# Patient Record
Sex: Female | Born: 1965 | Race: Black or African American | Hispanic: No | Marital: Married | Smoking: Never smoker
Health system: Southern US, Community
[De-identification: ages and names within clinical notes are randomized; demographics above are authoritative.]

## PROBLEM LIST (undated history)

## (undated) HISTORY — PX: ABDOMINAL HYSTERECTOMY: SHX81

## (undated) HISTORY — PX: KNEE ARTHROSCOPY: SHX127

---

## 2000-08-23 ENCOUNTER — Emergency Department (HOSPITAL_COMMUNITY): Admission: EM | Admit: 2000-08-23 | Discharge: 2000-08-24 | Payer: Self-pay | Admitting: Emergency Medicine

## 2001-04-20 ENCOUNTER — Other Ambulatory Visit: Admission: RE | Admit: 2001-04-20 | Discharge: 2001-04-20 | Payer: Self-pay | Admitting: Family Medicine

## 2001-04-24 ENCOUNTER — Encounter: Payer: Self-pay | Admitting: Gastroenterology

## 2001-04-24 ENCOUNTER — Encounter: Admission: RE | Admit: 2001-04-24 | Discharge: 2001-04-24 | Payer: Self-pay | Admitting: Gastroenterology

## 2008-09-08 ENCOUNTER — Emergency Department (HOSPITAL_COMMUNITY): Admission: EM | Admit: 2008-09-08 | Discharge: 2008-09-08 | Payer: Self-pay | Admitting: Family Medicine

## 2009-02-22 ENCOUNTER — Emergency Department (HOSPITAL_COMMUNITY): Admission: EM | Admit: 2009-02-22 | Discharge: 2009-02-22 | Payer: Self-pay | Admitting: Family Medicine

## 2009-06-23 ENCOUNTER — Emergency Department (HOSPITAL_COMMUNITY): Admission: EM | Admit: 2009-06-23 | Discharge: 2009-06-23 | Payer: Self-pay | Admitting: Emergency Medicine

## 2010-05-05 LAB — POCT URINALYSIS DIP (DEVICE)
Bilirubin Urine: NEGATIVE
Glucose, UA: NEGATIVE mg/dL
Ketones, ur: NEGATIVE mg/dL
Nitrite: POSITIVE — AB
Protein, ur: 100 mg/dL — AB
Specific Gravity, Urine: >=1.03 (ref 1.005–1.030)
Urobilinogen, UA: 0.2 mg/dL (ref 0.0–1.0)
pH: 5.5 (ref 5.0–8.0)

## 2010-05-05 LAB — URINE CULTURE: Colony Count: >=100000

## 2010-05-24 LAB — POCT URINALYSIS DIP (DEVICE)
Bilirubin Urine: NEGATIVE
Glucose, UA: NEGATIVE mg/dL
Nitrite: POSITIVE — AB
Protein, ur: 100 mg/dL — AB
Specific Gravity, Urine: >=1.03 (ref 1.005–1.030)
Urobilinogen, UA: 1 mg/dL (ref 0.0–1.0)
pH: 5.5 (ref 5.0–8.0)

## 2014-09-24 ENCOUNTER — Other Ambulatory Visit: Payer: Self-pay | Admitting: Sports Medicine

## 2014-09-24 DIAGNOSIS — M545 Low back pain: Secondary | ICD-10-CM

## 2014-10-03 ENCOUNTER — Ambulatory Visit
Admission: RE | Admit: 2014-10-03 | Discharge: 2014-10-03 | Disposition: A | Discharge status: Home / Self Care | Payer: Self-pay | Source: Ambulatory Visit | Attending: Sports Medicine | Admitting: Sports Medicine

## 2014-10-03 DIAGNOSIS — M545 Low back pain: Secondary | ICD-10-CM

## 2015-05-10 ENCOUNTER — Emergency Department (HOSPITAL_COMMUNITY)
Admission: EM | Admit: 2015-05-10 | Discharge: 2015-05-10 | Disposition: A | Discharge status: Home / Self Care | Payer: BLUE CROSS/BLUE SHIELD | Attending: Emergency Medicine | Admitting: Emergency Medicine

## 2015-05-10 ENCOUNTER — Encounter (HOSPITAL_COMMUNITY): Payer: Self-pay

## 2015-05-10 DIAGNOSIS — R42 Dizziness and giddiness: Secondary | ICD-10-CM | POA: Diagnosis not present

## 2015-05-10 DIAGNOSIS — Z79899 Other long term (current) drug therapy: Secondary | ICD-10-CM | POA: Diagnosis not present

## 2015-05-10 MED ORDER — MECLIZINE HCL 25 MG PO TABS
12.5000 mg | ORAL_TABLET | Freq: Once | ORAL | Status: AC
  Administered 2015-05-10: 12.5 mg via ORAL
  Filled 2015-05-10: qty 1

## 2015-05-10 MED ORDER — MECLIZINE HCL 25 MG PO TABS: 25.0000 mg | ORAL_TABLET | Freq: Three times a day (TID) | ORAL | Status: DC | PRN

## 2015-05-10 MED ORDER — ONDANSETRON 4 MG PO TBDP
4.0000 mg | ORAL_TABLET | Freq: Once | ORAL | Status: AC
  Administered 2015-05-10: 4 mg via ORAL
  Filled 2015-05-10: qty 1

## 2015-05-10 MED ORDER — ONDANSETRON 4 MG PO TBDP: 4.0000 mg | ORAL_TABLET | Freq: Three times a day (TID) | ORAL | Status: DC | PRN

## 2015-05-10 NOTE — ED Provider Notes (Signed)
CSN: [id number]     Arrival date & time 05/10/15  0904 History   First MD Initiated Contact with Patient 05/10/15 0940     Chief Complaint  Patient presents with  . Dizziness     (Consider location/radiation/quality/duration/timing/severity/associated sxs/prior Treatment) The history is provided by the patient.     Jodi Holloway is a 50 year old female with history of asthma, seasonal allergies and GERD, she presents to the emergency room with complaints of dizziness that she first experienced a year ago, which has recently worsened over the past week.  Patient states that when she is laying on her side in bed and she rolls over it will trigger a spinning of the room that resolved when she sits upright. She states that it only lasts for a few minutes at a time. This morning it occurred and she had associated nausea and felt like she was going to have a bowel movement. Patient describes the dizziness as the room spinning.  She denies associated vomiting, diaphoresis, palpitations, headache, visual disturbances, tinnitus, decreased hearing. The episode this morning began at approximately 8:15 when she got out of bed.  Although the acute dizziness improved, she tried to shower and "felt weird," "didn't feel right."  When she is bending over to wash her leg in the shower she felt like she might lose her balance.   This concerned her and she presented to the emergency room.  She currently has persistent nausea and mild dizziness.  She also states when she massages the back right side of her head and neck she can reproduce his symptoms. She has not seen anyone for this issue before.   She denies recent illness. She denies diplopia, dysphasia, weakness or numbness.  She denies chest pain, shortness of breath, palpitations, diaphoresis.  She denies a history of headaches.     History reviewed. No pertinent past medical history. Past Surgical History  Procedure Laterality Date  . Knee arthroscopy    .  Abdominal hysterectomy     Family History  Problem Relation Age of Onset  . Cancer Mother   . Hyperlipidemia Mother   . Diabetes Father    Social History  Substance Use Topics  . Smoking status: Never Smoker   . Smokeless tobacco: None  . Alcohol Use: Yes     Comment: occasionally   OB History    Gravida Para Term Preterm AB TAB SAB Ectopic Multiple Living   1 1 1       1      Review of Systems  Constitutional: Negative.  Negative for fever, diaphoresis, activity change and appetite change.  HENT: Negative.  Negative for tinnitus.   Eyes: Negative for photophobia, pain, redness and visual disturbance.  Respiratory: Negative.   Cardiovascular: Negative.  Negative for chest pain, palpitations and leg swelling.  Gastrointestinal: Positive for nausea. Negative for vomiting, abdominal pain, diarrhea, constipation and abdominal distention.  Genitourinary: Negative.   Musculoskeletal: Negative.   Skin: Negative.  Negative for color change, pallor and rash.  Neurological: Negative.   Psychiatric/Behavioral: Negative.   All other systems reviewed and are negative.     Allergies  Review of patient's allergies indicates no known allergies.  Home Medications   Prior to Admission medications   Medication Sig Start Date End Date Taking? Authorizing Provider  fexofenadine (ALLEGRA) 180 MG tablet Take 180 mg by mouth daily.   Yes Historical Provider, MD  Fluticasone-Salmeterol (ADVAIR) 100-50 MCG/DOSE AEPB Inhale 1 puff into the lungs 2 (two) times  daily.   Yes Historical Provider, MD  naproxen sodium (ANAPROX) 220 MG tablet Take 440 mg by mouth 2 (two) times daily as needed. For pain   Yes Historical Provider, MD  omeprazole (PRILOSEC) 20 MG capsule Take 20 mg by mouth daily.   Yes Historical Provider, MD  meclizine (ANTIVERT) 25 MG tablet Take 1 tablet (25 mg total) by mouth 3 (three) times daily as needed for dizziness. 05/10/15   Danelle Berry, PA-C  ondansetron (ZOFRAN ODT) 4 MG  disintegrating tablet Take 1 tablet (4 mg total) by mouth every 8 (eight) hours as needed for nausea or vomiting. 05/10/15   Danelle Berry, PA-C   BP 119/79 mmHg  Pulse 66  Temp(Src) 98.8 F (37.1 C) (Oral)  Resp 16  Ht 6' (1.829 m)  Wt 90.719 kg  BMI 27.12 kg/m2  SpO2 100% Physical Exam  Constitutional: She is oriented to person, place, and time. She appears well-developed and well-nourished. No distress.  HENT:  Head: Normocephalic and atraumatic.  Nose: Nose normal.  Mouth/Throat: Oropharynx is clear and moist. No oropharyngeal exudate.  Eyes: Conjunctivae, EOM and lids are normal. Pupils are equal, round, and reactive to light. Right eye exhibits no discharge. Left eye exhibits no discharge. No scleral icterus. Right eye exhibits normal extraocular motion and no nystagmus. Left eye exhibits normal extraocular motion and no nystagmus.  Neck: Normal range of motion. No JVD present. No tracheal deviation present. No thyromegaly present.  Cardiovascular: Normal rate, regular rhythm, normal heart sounds and intact distal pulses.  Exam reveals no gallop and no friction rub.   No murmur heard. Pulmonary/Chest: Effort normal and breath sounds normal. No respiratory distress. She has no wheezes. She has no rales. She exhibits no tenderness.  Abdominal: Soft. Bowel sounds are normal. She exhibits no distension and no mass. There is no tenderness. There is no rebound and no guarding.  Musculoskeletal: Normal range of motion. She exhibits no edema or tenderness.  Lymphadenopathy:    She has no cervical adenopathy.  Neurological: She is alert and oriented to person, place, and time. She has normal reflexes. No cranial nerve deficit. She exhibits normal muscle tone. Coordination normal.  Speech is clear and goal oriented, follows 2 step commands CN 2-12 grossly intact Normal strength in upper and lower extremities bilaterally including dorsiflexion and plantar flexion, strong and equal grip  strength Sensation normal to light and sharp touch Moves extremities without ataxia, coordination intact Normal finger to nose and rapid alternating movements Neg romberg, no pronator drift Normal gait and balance   Skin: Skin is warm and dry. No rash noted. She is not diaphoretic. No erythema. No pallor.  Psychiatric: She has a normal mood and affect. Her behavior is normal. Judgment and thought content normal.  Nursing note and vitals reviewed.   ED Course  Procedures (including critical care time) Labs Review Labs Reviewed - No data to display  Imaging Review No results found. I have personally reviewed and evaluated these images and lab results as part of my medical decision-making.   EKG Interpretation   Date/Time:  Saturday May 10 2015 10:02:14 EDT Ventricular Rate:  83 PR Interval:  188 QRS Duration: 80 QT Interval:  357 QTC Calculation: 419 R Axis:   42 Text Interpretation:  Sinus rhythm Low voltage, precordial leads since  last tracing no significant change Confirmed by BELFI  MD, MELANIE ([id number])  on 05/10/2015 3:36:39 PM      MDM   Pt with dizziness with changes in  position of head, specifically with extension, and when rolling over in bed.  Episodes are reproducible and last <29min.   Patient is afebrile, vital signs stable, nontoxic in appearance.  History of dizziness is most consistent with BPPV, and non-concerning for central vertigo, no red flags. Normal neurological exam, no nuchal rigidity, no fever, no visual disturbances.  She is able ambulate without difficulty, negative Romberg.  Patient has not had any recent illness, do not suspect labyrinthitis. She doesn't have any hearing change or tinnitus, vertigo episodes are brief, do not suspect Mnire's.  Vertigo will be treated with meclizine and Zofran as needed for nausea. She will follow-up with her own ENT, although the on-call ENT was provided for her. She is traveling out of town tonight and wanted to  be checked out prior to leaving.    The patient is safe to discharge home. Return precautions reviewed with patient, who verbalizes understanding. She is discharged in good condition. Vital signs stable.  Filed Vitals:   05/10/15 1115 05/10/15 1130 05/10/15 1140 05/10/15 1230  BP: 119/80 121/80 122/79 119/79  Pulse: 80 75  66  Temp:      TempSrc:      Resp: Height:      Weight:      SpO2: 100% 100%  100%      Final diagnoses:  Vertigo        Danelle Berry, PA-C 05/12/15 1610  Rolan Bucco, MD 05/17/15 [phone]

## 2015-05-10 NOTE — ED Notes (Signed)
Pt states she has been having off and on dizziness for a year that gets worse while laying down and is better if she sits or stands up, patient is alert and oriented during triage and denies any pain

## 2015-05-10 NOTE — Discharge Instructions (Signed)
Dizziness °Dizziness is a common problem. It is a feeling of unsteadiness or light-headedness. You may feel like you are about to faint. Dizziness can lead to injury if you stumble or fall. Anyone can become dizzy, but dizziness is more common in older adults. This condition can be caused by a number of things, including medicines, dehydration, or illness. °HOME CARE INSTRUCTIONS °Taking these steps may help with your condition: °Eating and Drinking °· Drink enough fluid to keep your urine clear or pale yellow. This helps to keep you from becoming dehydrated. Try to drink more clear fluids, such as water. °· Do not drink alcohol. °· Limit your caffeine intake if directed by your health care provider. °· Limit your salt intake if directed by your health care provider. °Activity °· Avoid making quick movements. °· Rise slowly from chairs and steady yourself until you feel okay. °· In the morning, first sit up on the side of the bed. When you feel okay, stand slowly while you hold onto something until you know that your balance is fine. °· Move your legs often if you need to stand in one place for a long time. Tighten and relax your muscles in your legs while you are standing. °· Do not drive or operate heavy machinery if you feel dizzy. °· Avoid bending down if you feel dizzy. Place items in your home so that they are easy for you to reach without leaning over. °Lifestyle °· Do not use any tobacco products, including cigarettes, chewing tobacco, or electronic cigarettes. If you need help quitting, ask your health care provider. °· Try to reduce your stress level, such as with yoga or meditation. Talk with your health care provider if you need help. °General Instructions °· Watch your dizziness for any changes. °· Take medicines only as directed by your health care provider. Talk with your health care provider if you think that your dizziness is caused by a medicine that you are taking. °· Tell a friend or a family  member that you are feeling dizzy. If he or she notices any changes in your behavior, have this person call your health care provider. °· Keep all follow-up visits as directed by your health care provider. This is important. °SEEK MEDICAL CARE IF: °· Your dizziness does not go away. °· Your dizziness or light-headedness gets worse. °· You feel nauseous. °· You have reduced hearing. °· You have new symptoms. °· You are unsteady on your feet or you feel like the room is spinning. °SEEK IMMEDIATE MEDICAL CARE IF: °· You vomit or have diarrhea and are unable to eat or drink anything. °· You have problems talking, walking, swallowing, or using your arms, hands, or legs. °· You feel generally weak. °· You are not thinking clearly or you have trouble forming sentences. It may take a friend or family member to notice this. °· You have chest pain, abdominal pain, shortness of breath, or sweating. °· Your vision changes. °· You notice any bleeding. °· You have a headache. °· You have neck pain or a stiff neck. °· You have a fever. °  °This information is not intended to replace advice given to you by your health care provider. Make sure you discuss any questions you have with your health care provider. °  °Document Released: 07/28/2000 Document Revised: 06/18/2014 Document Reviewed: 01/28/2014 °Elsevier Interactive Patient Education ©2016 [employer] ° °Benign Positional Vertigo °Vertigo is the feeling that you or your surroundings are moving when they are not.   Benign positional vertigo is the most common form of vertigo. The cause of this condition is not serious (is benign). This condition is triggered by certain movements and positions (is positional). This condition can be dangerous if it occurs while you are doing something that could endanger you or others, such as driving.  °CAUSES °In many cases, the cause of this condition is not known. It may be caused by a disturbance in an area of the inner ear that helps your  brain to sense movement and balance. This disturbance can be caused by a viral infection (labyrinthitis), head injury, or repetitive motion. °RISK FACTORS °This condition is more likely to develop in: °· Women. °· People who are 50 years of age or older. °SYMPTOMS °Symptoms of this condition usually happen when you move your head or your eyes in different directions. Symptoms may start suddenly, and they usually last for less than a minute. Symptoms may include: °· Loss of balance and falling. °· Feeling like you are spinning or moving. °· Feeling like your surroundings are spinning or moving. °· Nausea and vomiting. °· Blurred vision. °· Dizziness. °· Involuntary eye movement (nystagmus). °Symptoms can be mild and cause only slight annoyance, or they can be severe and interfere with daily life. Episodes of benign positional vertigo may return (recur) over time, and they may be triggered by certain movements. Symptoms may improve over time. °DIAGNOSIS °This condition is usually diagnosed by medical history and a physical exam of the head, neck, and ears. You may be referred to a health care provider who specializes in ear, nose, and throat (ENT) problems (otolaryngologist) or a provider who specializes in disorders of the nervous system (neurologist). You may have additional testing, including: °· MRI. °· A CT scan. °· Eye movement tests. Your health care provider may ask you to change positions quickly while he or she watches you for symptoms of benign positional vertigo, such as nystagmus. Eye movement may be tested with an electronystagmogram (ENG), caloric stimulation, the Dix-Hallpike test, or the roll test. °· An electroencephalogram (EEG). This records electrical activity in your brain. °· Hearing tests. °TREATMENT °Usually, your health care provider will treat this by moving your head in specific positions to adjust your inner ear back to normal. Surgery may be needed in severe cases, but this is rare. In  some cases, benign positional vertigo may resolve on its own in 2-4 weeks. °HOME CARE INSTRUCTIONS °Safety °· Move slowly. Avoid sudden body or head movements. °· Avoid driving. °· Avoid operating heavy machinery. °· Avoid doing any tasks that would be dangerous to you or others if a vertigo episode would occur. °· If you have trouble walking or keeping your balance, try using a cane for stability. If you feel dizzy or unstable, sit down right away. °· Return to your normal activities as told by your health care provider. Ask your health care provider what activities are safe for you. °General Instructions °· Take over-the-counter and prescription medicines only as told by your health care provider. °· Avoid certain positions or movements as told by your health care provider. °· Drink enough fluid to keep your urine clear or pale yellow. °· Keep all follow-up visits as told by your health care provider. This is important. °SEEK MEDICAL CARE IF: °· You have a fever. °· Your condition gets worse or you develop new symptoms. °· Your family or friends notice any behavioral changes. °· Your nausea or vomiting gets worse. °· You have numbness or a "  pins and needles" sensation. °SEEK IMMEDIATE MEDICAL CARE IF: °· You have difficulty speaking or moving. °· You are always dizzy. °· You faint. °· You develop severe headaches. °· You have weakness in your legs or arms. °· You have changes in your hearing or vision. °· You develop a stiff neck. °· You develop sensitivity to light. °  °This information is not intended to replace advice given to you by your health care provider. Make sure you discuss any questions you have with your health care provider. °  °Document Released: 11/09/2005 Document Revised: 10/23/2014 Document Reviewed: 05/27/2014 °Elsevier Interactive Patient Education ©2016 [employer] ° °

## 2015-05-10 NOTE — ED Notes (Signed)
Pt verbalized understanding of d/c instructions, prescriptions, and follow-up care. No further questions/concerns, VSS, ambulatory w/ steady gait (refused wheelchair) 

## 2017-01-09 IMAGING — MR MR LUMBAR SPINE W/O CM
4 of 5 series · 22 of 48 positions shown · non-contrast
Comparison: Lumbar spine radiographs 09/17/2014

CLINICAL DATA: Low back pain with hip pain for 3-4 months.

EXAM:
MRI LUMBAR SPINE WITHOUT CONTRAST
TECHNIQUE: Multiplanar, multisequence MR imaging of the lumbar spine was
performed. No intravenous contrast was administered.

[Series 4: T2 · sagittal · 4.0mm · 0.44mm/px · 6 of 12 slices shown (1 of 2)]
[im 1/12]
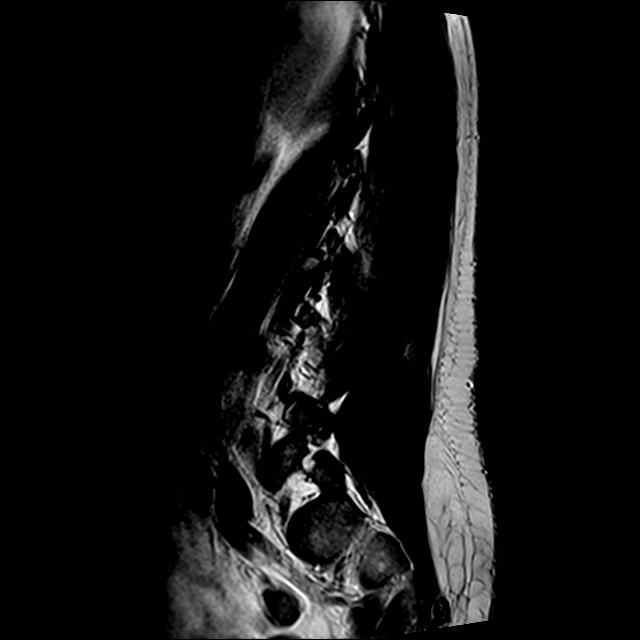
[im 3/12]
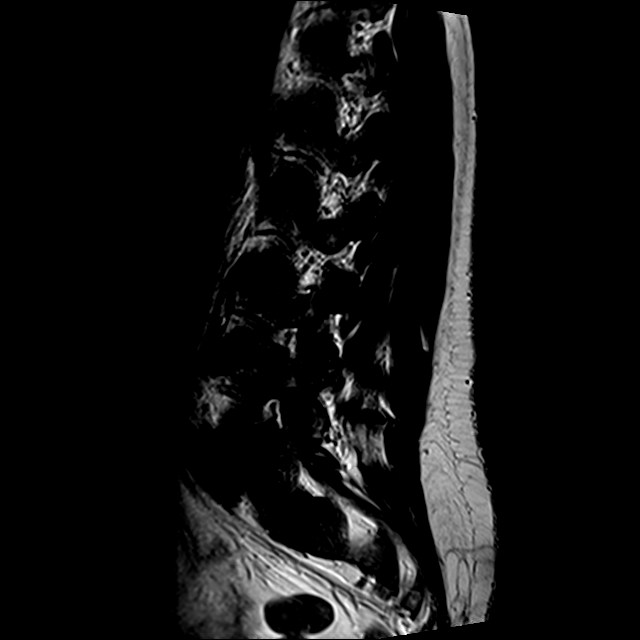
[im 5/12]
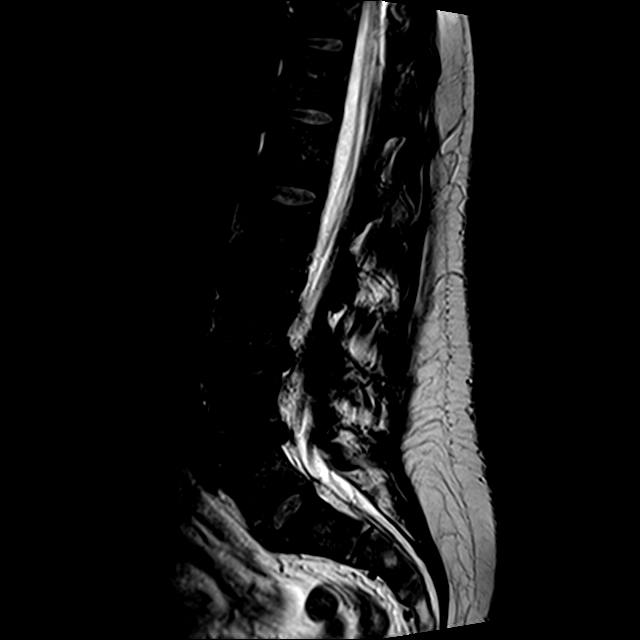
[im 7/12]
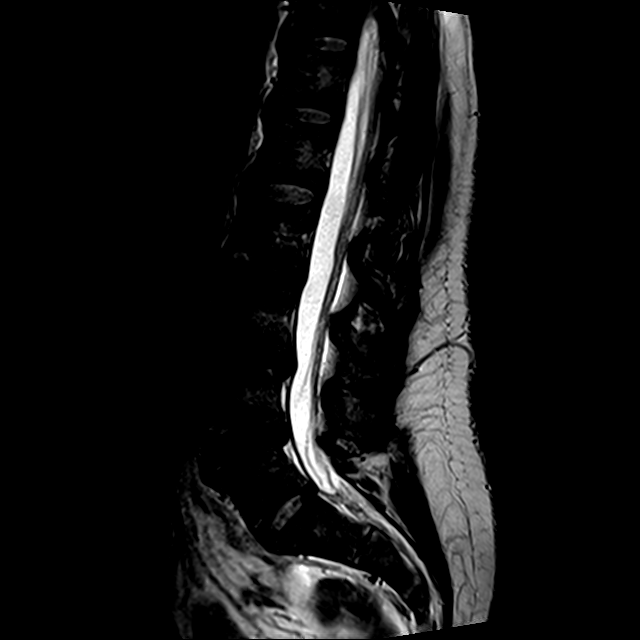
[im 9/12]
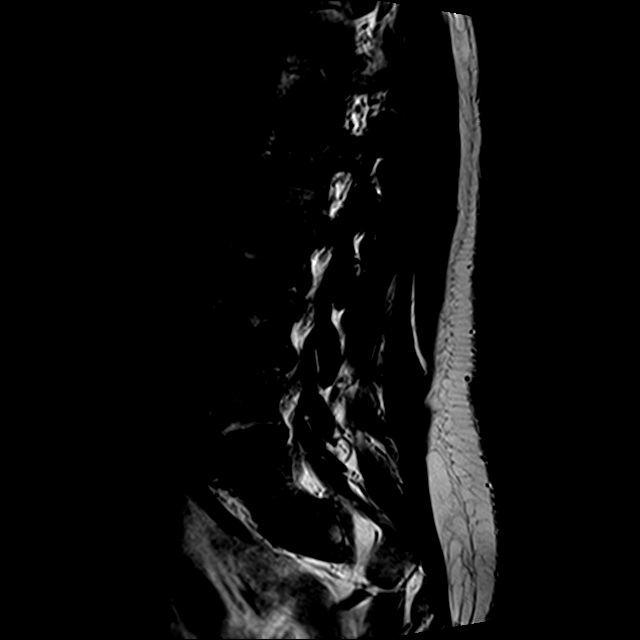
[im 12/12]
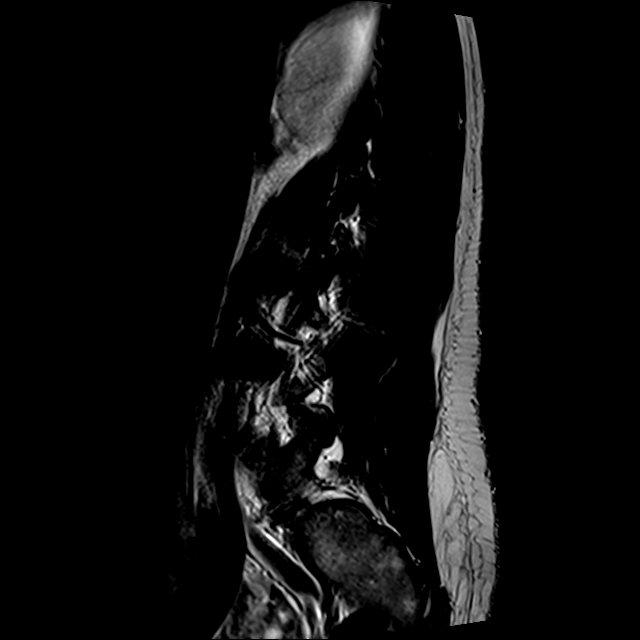

[Series 5: T1 · sagittal · 4.0mm · 0.55mm/px · 4 of 12 slices shown (1 of 2)]
[im 1/12]
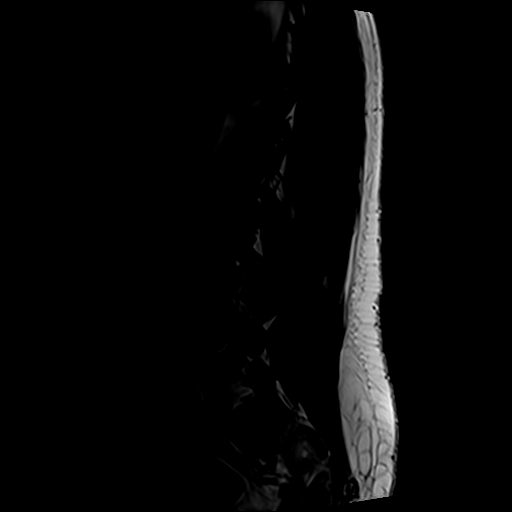
[im 3/12]
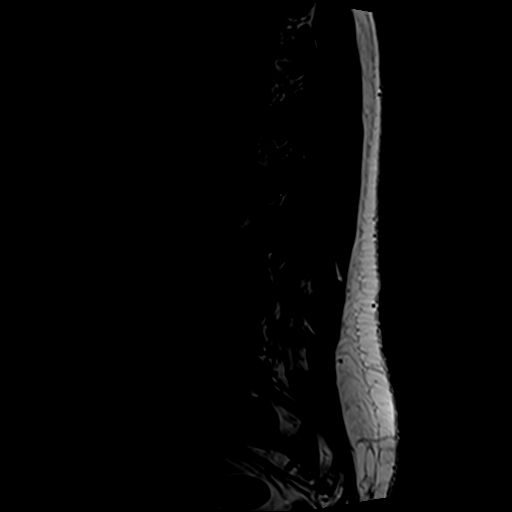
[im 7/12]
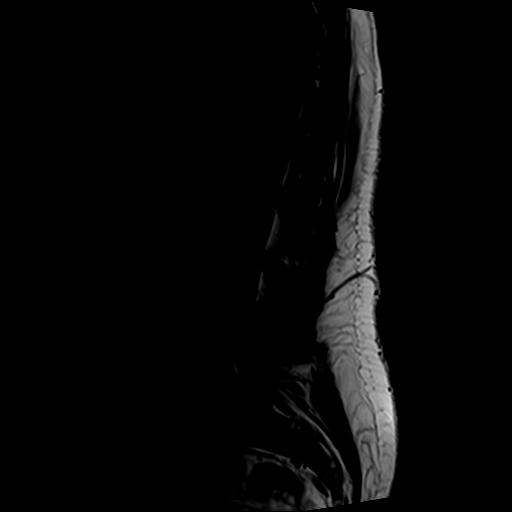
[im 12/12]
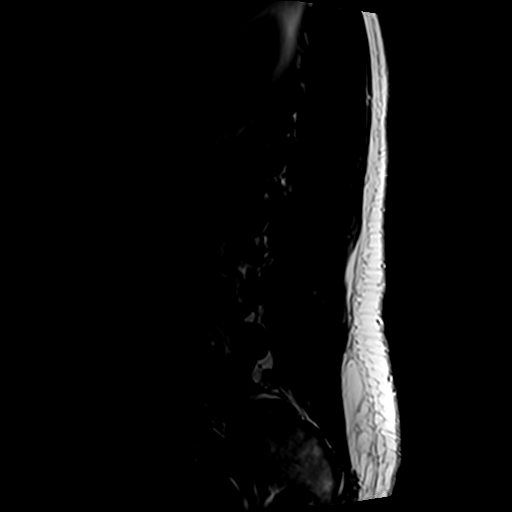

[Series 6: T1 · axial · 4.0mm · 0.37mm/px · z∈[-18,+127]mm · 3 of 30 slices shown (2 of 2)]
[im 5/30]
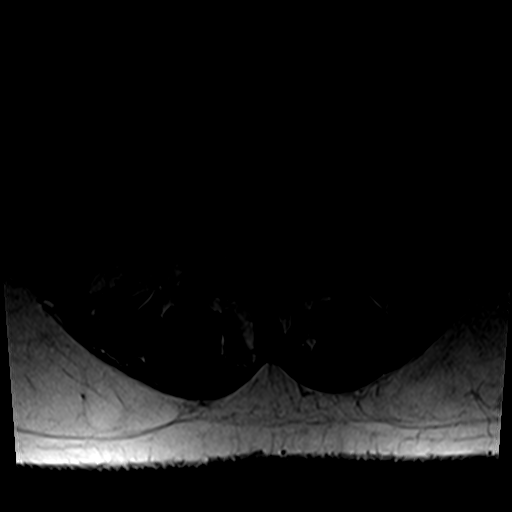
[im 15/30]
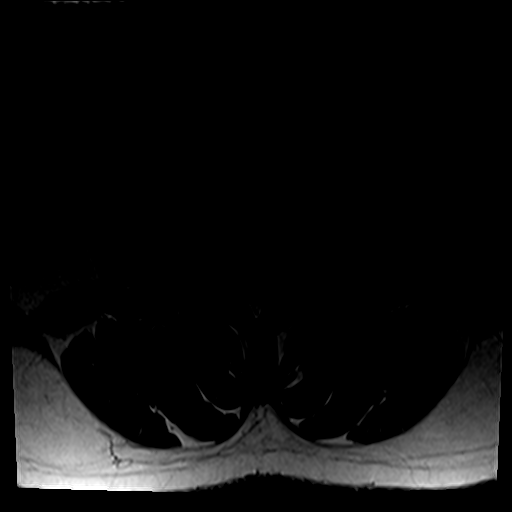
[im 25/30]
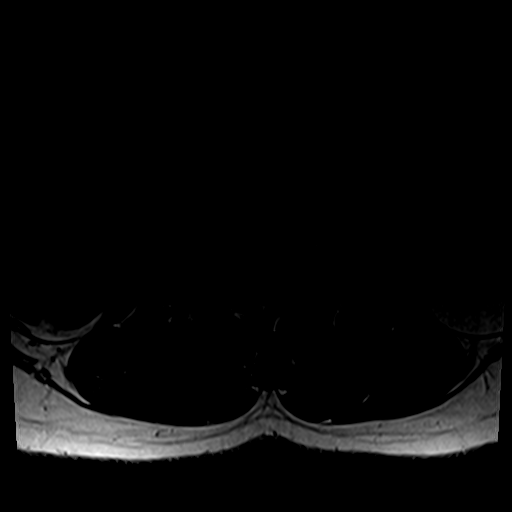

[Series 8: T2 · axial · 4.0mm · 0.74mm/px · z∈[-38,+179]mm · 9 of 30 slices shown (2 of 2)]
[im 1/30]
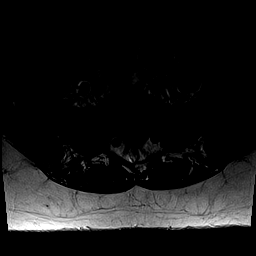
[im 5/30]
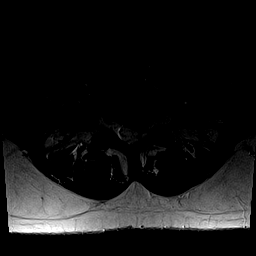
[im 9/30]
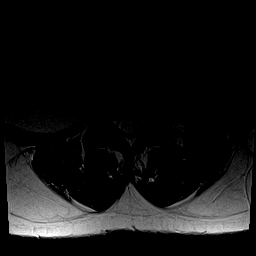
[im 13/30]
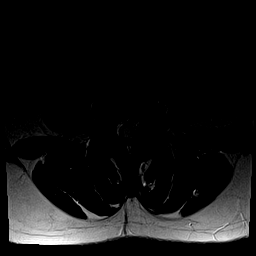
[im 15/30]
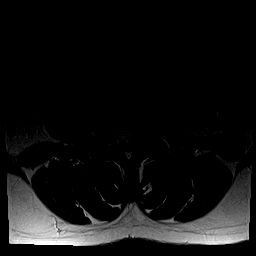
[im 17/30]
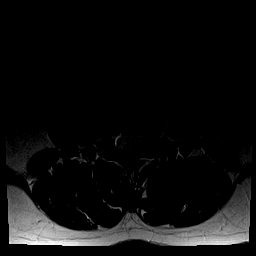
[im 21/30]
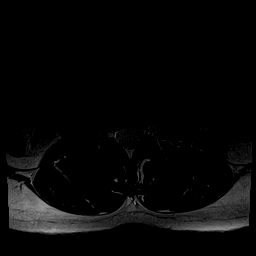
[im 25/30]
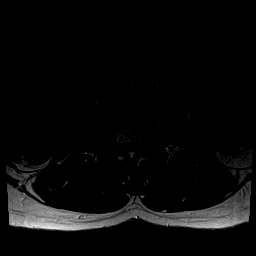
[im 30/30]
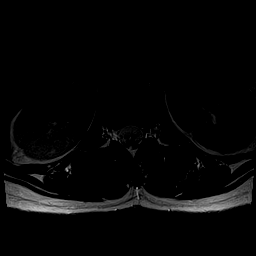

[22 of 48 positions shown; findings below may reference images not displayed]

FINDINGS: There is transitional lumbosacral anatomy. The same numbering used
on the prior radiographs will be continued on this examination, with
the lowest fully formed intervertebral disc space labeled L5-S1 and
with L5 being partially sacralized. Hypoplastic ribs are present at
T12 on the comparison radiographs.

Retrolisthesis of L3 on L4 and L4 on L5 measures 5 mm and 8 mm,
respectively. Vertebral body heights are preserved. Disc desiccation
is present from L2-3 to L4-5. There is mild-to-moderate degenerative
endplate edema at L3-4 associated with a small L3 inferior endplate
Schmorl's node which may be or acute or subacute. There is minimal
degenerative endplate edema at L4-5. Mild disc space narrowing is
present at L3-4 and L4-5. Conus medullaris is normal in signal and
terminates at the superior aspect of L1. Paraspinal soft tissues are
unremarkable.

L1-2:  Negative.

L2-3:  Mild disc bulging without stenosis.

L3-4: Listhesis with moderate disc uncovering and mild to moderate
facet hypertrophy result in mild bilateral neural foraminal stenosis
and mild bilateral lateral recess stenosis. No spinal stenosis.

L4-5: Listhesis with disc uncovering and moderate facet hypertrophy
result in mild left lateral recess and mild bilateral neural
foraminal stenosis without spinal stenosis.

L5-S1:  Negative.
IMPRESSION: Facet mediated grade 1 retrolisthesis at L3-4 and L4-5 with mild
bilateral neural foraminal and lateral recess stenosis. No spinal
stenosis.

## 2018-08-09 ENCOUNTER — Other Ambulatory Visit: Payer: Self-pay | Admitting: *Deleted

## 2018-08-09 DIAGNOSIS — Z20822 Contact with and (suspected) exposure to covid-19: Secondary | ICD-10-CM

## 2018-08-13 LAB — NOVEL CORONAVIRUS, NAA: SARS-CoV-2, NAA: NOT DETECTED

## 2020-08-22 ENCOUNTER — Ambulatory Visit: Payer: BC Managed Care – PPO | Attending: Internal Medicine

## 2020-08-22 DIAGNOSIS — Z23 Encounter for immunization: Secondary | ICD-10-CM

## 2020-08-22 NOTE — Progress Notes (Signed)
   Cov[facility] Vaccination [facility]  Name:  Jodi Holloway    MRN: [id number] DOB: November 26, 1965  08/22/2020  [name] was observed post Covid-19 immunization for 15 minutes without incident. She was provided with Vaccine Information Sheet and instruction to access the V-Safe system.   [name] was instructed to call [phone] with any severe reactions post vaccine: Difficulty breathing  Swelling of face and throat  A fast heartbeat  A bad rash all over body  Dizziness and weakness   Immunizations Administered     Name Date Dose VIS Date Route   PFIZER Comrnaty(Gray TOP) Covid-19 Vaccine 08/22/2020  1:16 PM 0.3 mL 01/24/2020 Intramuscular   Manufacturer: [employer], [employer]   Lot: [id number]   NDC: [phone]

## 2020-08-29 ENCOUNTER — Other Ambulatory Visit (HOSPITAL_BASED_OUTPATIENT_CLINIC_OR_DEPARTMENT_OTHER): Payer: Self-pay

## 2020-08-29 MED ORDER — COVID-19 MRNA VAC-TRIS(PFIZER) 30 MCG/0.3ML IM SUSP
INTRAMUSCULAR | 0 refills | Status: DC
  Filled 2020-08-29: qty 0.3, 1d supply, fill #0

## 2020-10-21 ENCOUNTER — Other Ambulatory Visit (HOSPITAL_BASED_OUTPATIENT_CLINIC_OR_DEPARTMENT_OTHER): Payer: Self-pay

## 2022-04-30 ENCOUNTER — Other Ambulatory Visit (HOSPITAL_COMMUNITY): Payer: Self-pay

## 2022-08-31 ENCOUNTER — Encounter (HOSPITAL_COMMUNITY): Payer: Self-pay | Admitting: Emergency Medicine

## 2022-08-31 ENCOUNTER — Ambulatory Visit (HOSPITAL_COMMUNITY)
Admission: EM | Admit: 2022-08-31 | Discharge: 2022-08-31 | Disposition: A | Discharge status: Home / Self Care | Payer: BC Managed Care – PPO | Attending: Nurse Practitioner | Admitting: Nurse Practitioner

## 2022-08-31 ENCOUNTER — Telehealth (HOSPITAL_COMMUNITY): Payer: Self-pay | Admitting: Emergency Medicine

## 2022-08-31 DIAGNOSIS — R21 Rash and other nonspecific skin eruption: Secondary | ICD-10-CM | POA: Diagnosis not present

## 2022-08-31 DIAGNOSIS — S80869A Insect bite (nonvenomous), unspecified lower leg, initial encounter: Secondary | ICD-10-CM

## 2022-08-31 MED ORDER — TRIAMCINOLONE ACETONIDE 0.1 % EX CREA: 1.0000 | TOPICAL_CREAM | Freq: Two times a day (BID) | CUTANEOUS | 0 refills | Status: AC

## 2022-08-31 MED ORDER — METHYLPREDNISOLONE 4 MG PO TBPK: ORAL_TABLET | ORAL | 0 refills | Status: AC

## 2022-08-31 NOTE — ED Provider Notes (Signed)
[facility]    CSN: [id number] Arrival date & time: 08/31/22  1208      History   Chief Complaint Chief Complaint  Patient presents with   Rash    HPI Jodi Holloway is a 57 y.o. female.   HPI  She is in today for evaluation of a rash.  She reports that she went to the [place] on a work-related trip.  She was informed prior to going that they do have mosquitoes to do all with the.  So she developed a few bites on her right posterior calf.  She used some topical creams, steroid and calamine lotion which were not effective.  She reports that the rash has continued to spread up her legs.  She denies any shortness of breath, chest pains, nausea, vomiting or any other symptoms. History reviewed. No pertinent past medical history.  There are no problems to display for this patient.   Past Surgical History:  Procedure Laterality Date   ABDOMINAL HYSTERECTOMY     KNEE ARTHROSCOPY      OB History     Gravida  1   Para  1   Term  1   Preterm      AB      Living  1      SAB      IAB      Ectopic      Multiple      Live Births               Home Medications    Prior to Admission medications   Medication Sig Start Date End Date Taking? Authorizing Provider  methylPREDNISolone (MEDROL) 4 MG TBPK tablet Follow package instructions. 08/31/22 09/06/22 Yes Barbette Merino, NP    Family History Family History  Problem Relation Age of Onset   Cancer Mother    Hyperlipidemia Mother    Diabetes Father     Social History Social History   Tobacco Use   Smoking status: Never  Substance Use Topics   Alcohol use: Yes    Comment: occasionally   Drug use: No     Allergies   Patient has no known allergies.   Review of Systems Review of Systems   Physical Exam Triage Vital Signs ED Triage Vitals  Encounter Vitals Group     BP 08/31/22 1223 116/75     Systolic BP Percentile --      Diastolic BP Percentile --      Pulse Rate  08/31/22 1223 73     Resp 08/31/22 1223 16     Temp 08/31/22 1223 97.9 F (36.6 C)     Temp Source 08/31/22 1223 Oral     SpO2 08/31/22 1223 96 %     Weight --      Height --      Head Circumference --      Peak Flow --      Pain Score 08/31/22 1222 0     Pain Loc --      Pain Education --      Exclude from Growth Chart --    No data found.  Updated Vital Signs BP 116/75 (BP Location: Right Arm)   Pulse 73   Temp 97.9 F (36.6 C) (Oral)   Resp 16   SpO2 96%   Visual Acuity Right Eye Distance:   Left Eye Distance:   Bilateral Distance:    Right Eye Near:   Left Eye Near:  Bilateral Near:     Physical Exam Constitutional:      General: She is not in acute distress.    Appearance: She is normal weight.  HENT:     Head: Normocephalic and atraumatic.     Nose: Nose normal.     Mouth/Throat:     Mouth: Mucous membranes are moist.  Cardiovascular:     Rate and Rhythm: Normal rate.  Pulmonary:     Effort: Pulmonary effort is normal.  Musculoskeletal:        General: Normal range of motion.  Skin:    General: Skin is warm and dry.     Capillary Refill: Capillary refill takes less than 2 seconds.     Coloration: Skin is not pale.     Findings: Erythema and rash present. No abscess, bruising, ecchymosis, signs of injury, lesion or petechiae.  Neurological:     General: No focal deficit present.     Mental Status: She is alert and oriented to person, place, and time.      UC Treatments / Results  Labs (all labs ordered are listed, but only abnormal results are displayed) Labs Reviewed - No data to display  EKG   Radiology No results found.  Procedures Procedures (including critical care time)  Medications Ordered in UC Medications - No data to display  Initial Impression / Assessment and Plan / UC Course  I have reviewed the triage vital signs and the nursing notes.  Pertinent labs & imaging results that were available during my care of the  patient were reviewed by me and considered in my medical decision making (see chart for details).     Rash Final Clinical Impressions(s) / UC Diagnoses   Final diagnoses:  Rash  Insect bite of lower leg, unspecified laterality, initial encounter     Discharge Instructions      You have been prescribed a Medrol Dosepak to take as directed.  You have also been prescribed a topical steroid cream triamcinolone to use twice daily.  If your symptoms do not improve or worsen please follow-up here or with your primary care provider for further evaluation.    ED Prescriptions     Medication Sig Dispense Auth. Provider   methylPREDNISolone (MEDROL) 4 MG TBPK tablet Follow package instructions. 21 tablet Barbette Merino, NP      PDMP not reviewed this encounter.   Thad Ranger [place], [place] 08/31/22 1331

## 2022-08-31 NOTE — ED Triage Notes (Signed)
Pt reports Sunday night that appeared to have mosquito bites on leg and put cortisone and calamine creams on them bt rash is now spreading. Reports itching

## 2022-08-31 NOTE — Telephone Encounter (Signed)
Patient called and states she was expecting a steroid cream.  Reviewed with Crystal, APP and prescription sent

## 2022-08-31 NOTE — Discharge Instructions (Addendum)
You have been prescribed a Medrol Dosepak to take as directed.  You have also been prescribed a topical steroid cream triamcinolone to use twice daily.  If your symptoms do not improve or worsen please follow-up here or with your primary care provider for further evaluation.
